# Patient Record
Sex: Male | Born: 1985 | Hispanic: No | Marital: Married | Smoking: Never smoker
Health system: Southern US, Community
[De-identification: ages and names within clinical notes are randomized; demographics above are authoritative.]

## PROBLEM LIST (undated history)

## (undated) ENCOUNTER — Emergency Department (HOSPITAL_COMMUNITY): Admission: EM | Payer: Managed Care, Other (non HMO) | Source: Home / Self Care

## (undated) DIAGNOSIS — M5030 Other cervical disc degeneration, unspecified cervical region: Secondary | ICD-10-CM

## (undated) HISTORY — PX: WISDOM TOOTH EXTRACTION: SHX21

## (undated) HISTORY — PX: CERVICAL SPINE SURGERY: SHX589

---

## 2013-06-19 ENCOUNTER — Encounter (HOSPITAL_COMMUNITY): Payer: Self-pay | Admitting: Emergency Medicine

## 2013-06-19 ENCOUNTER — Emergency Department (HOSPITAL_COMMUNITY)
Admission: EM | Admit: 2013-06-19 | Discharge: 2013-06-19 | Disposition: A | Discharge status: Home / Self Care | Payer: Managed Care, Other (non HMO) | Attending: Emergency Medicine | Admitting: Emergency Medicine

## 2013-06-19 DIAGNOSIS — J029 Acute pharyngitis, unspecified: Secondary | ICD-10-CM | POA: Insufficient documentation

## 2013-06-19 DIAGNOSIS — J069 Acute upper respiratory infection, unspecified: Secondary | ICD-10-CM

## 2013-06-19 DIAGNOSIS — Z792 Long term (current) use of antibiotics: Secondary | ICD-10-CM | POA: Insufficient documentation

## 2013-06-19 MED ORDER — PSEUDOEPHEDRINE HCL ER 120 MG PO TB12: 120.0000 mg | ORAL_TABLET | Freq: Two times a day (BID) | ORAL | Status: DC

## 2013-06-19 MED ORDER — FLUTICASONE PROPIONATE 50 MCG/ACT NA SUSP: 2.0000 | Freq: Every day | NASAL | Status: DC

## 2013-06-19 NOTE — ED Notes (Signed)
Pt c/o sore throat and nasal congestion starting last night.  Pain score 6/10.  Vitals are stable.  Sts recent travel to [place].

## 2013-06-19 NOTE — ED Provider Notes (Signed)
CSN: [id number]     Arrival date & time 06/19/13  1341 History  This chart was scribed for non-physician practitioner Mora Bellman, PA-C working with Derwood Kaplan, MD by Valera Castle, ED scribe. This patient was seen in room WTR6/WTR6 and the patient's care was started at 3:07 PM.    Chief Complaint  Patient presents with  . Sore Throat  . Nasal Congestion    The history is provided by the patient. No language interpreter was used.   HPI Comments: Scott Cline is a 27 y.o. male who presents to the Emergency Department complaining of gradual, moderate, constant sore throat, with a pain severity of 6/10, onset last night. He reports associated nasal congestion, itchiness to his right ear, unproductive cough, frontal headaches, and pain with swallowing. He reports that with cold weather it hurts to breathe, but denies deep breathing exacerbating the pain. He reports that he thinks he caught these symptoms from his brother. His brother's symptoms have now resolved. He reports taking medication for fever prevention PTA, but states he cannot remember the name. He reports recent travel to [place].  He denies fever, and any other associated symptoms. He denies smoking, or EtOH use. He has no known allergies, and denies any pertinent, prior medical history.    History reviewed. No pertinent past medical history. Past Surgical History  Procedure Laterality Date  . Wisdom tooth extraction     History reviewed. No pertinent family history. History  Substance Use Topics  . Smoking status: Never Smoker   . Smokeless tobacco: Never Used  . Alcohol Use: No    Review of Systems  Constitutional: Negative for fever.  HENT: Positive for congestion (Nasal congestion.), sore throat and trouble swallowing.        Itchiness to right ear.  Respiratory: Positive for cough. Negative for shortness of breath.   Neurological: Positive for headaches.  All other systems reviewed and are  negative.    Allergies  Review of patient's allergies indicates no known allergies.  Home Medications   Current Outpatient Rx  Name  Route  Sig  Dispense  Refill  . amoxicillin (AMOXIL) 500 MG tablet   Oral   Take 500 mg by mouth 3 (three) times daily.         Marland Kitchen HYDROcodone-acetaminophen (NORCO/VICODIN) 5-325 MG per tablet   Oral   Take 1 tablet by mouth every 6 (six) hours as needed for pain.          Triage Vitals: BP 125/81  Pulse 65  Temp(Src) 98.7 F (37.1 C) (Oral)  Resp 20  SpO2 100%  Physical Exam  Nursing note and vitals reviewed. Constitutional: He is oriented to person, place, and time. Vital signs are normal. He appears well-developed and well-nourished. He does not appear ill. No distress.  HENT:  Head: Normocephalic and atraumatic.  Right Ear: Tympanic membrane, external ear and ear canal normal.  Left Ear: Tympanic membrane, external ear and ear canal normal.  Nose: Nose normal. Right sinus exhibits no maxillary sinus tenderness and no frontal sinus tenderness. Left sinus exhibits no maxillary sinus tenderness and no frontal sinus tenderness.  Mouth/Throat: Uvula is midline and oropharynx is clear and moist. No trismus in the jaw. No uvula swelling. No oropharyngeal exudate or posterior oropharyngeal edema.  Mild nasal turbinate swelling bilaterally.   Eyes: Conjunctivae and EOM are normal. Pupils are equal, round, and reactive to light.  Neck: Trachea normal, normal range of motion and phonation normal. No tracheal deviation  present.  No nuchal rigidity or meningeal signs  Cardiovascular: Normal rate, regular rhythm, normal heart sounds, intact distal pulses and normal pulses.   Pulmonary/Chest: Effort normal and breath sounds normal. No stridor. No respiratory distress. He has no decreased breath sounds. He has no wheezes. He has no rales.  Abdominal: Soft. He exhibits no distension. There is no tenderness. There is no rigidity and no guarding.   Musculoskeletal: Normal range of motion.  Lymphadenopathy:    He has no cervical adenopathy.  Neurological: He is alert and oriented to person, place, and time.  Skin: Skin is warm and dry. No rash noted. He is not diaphoretic.  Psychiatric: He has a normal mood and affect. His behavior is normal.    ED Course  Procedures (including critical care time)  DIAGNOSTIC STUDIES: Oxygen Saturation is 100% on room air, normal by my interpretation.    COORDINATION OF CARE: 3:11 PM-Discussed treatment plan with pt at bedside and pt agreed to plan. Advised pt to f/u in 10 days if the symptoms worsen.      Labs Review Labs Reviewed  RAPID STREP SCREEN  CULTURE, GROUP A STREP   Imaging Review No results found.  MDM   1. URI (upper respiratory infection)    Pt's lungs CTA bilaterally, oxygen saturation 100% on RA. Patients symptoms are consistent with URI, likely viral etiology. Discussed that antibiotics are not indicated for viral infections. Pt will be discharged with symptomatic treatment.  Verbalizes understanding and is agreeable with plan. Pt is hemodynamically stable & in NAD prior to dc.    I personally performed the services described in this documentation, which was scribed in my presence. The recorded information has been reviewed and is accurate.     Mora Bellman, PA-C 06/19/13 [phone]

## 2013-06-20 NOTE — ED Provider Notes (Signed)
Medical screening examination/treatment/procedure(s) were performed by non-physician practitioner and as supervising physician I was immediately available for consultation/collaboration.  Derwood Kaplan, MD 06/20/13 [phone]

## 2013-06-21 LAB — CULTURE, GROUP A STREP

## 2016-04-23 ENCOUNTER — Encounter (HOSPITAL_COMMUNITY): Payer: Self-pay | Admitting: Emergency Medicine

## 2016-04-23 ENCOUNTER — Emergency Department (HOSPITAL_COMMUNITY): Payer: PPO

## 2016-04-23 ENCOUNTER — Emergency Department (HOSPITAL_COMMUNITY)
Admission: EM | Admit: 2016-04-23 | Discharge: 2016-04-23 | Disposition: A | Discharge status: Home / Self Care | Payer: PPO | Attending: Emergency Medicine | Admitting: Emergency Medicine

## 2016-04-23 DIAGNOSIS — N211 Calculus in urethra: Secondary | ICD-10-CM | POA: Insufficient documentation

## 2016-04-23 DIAGNOSIS — N23 Unspecified renal colic: Secondary | ICD-10-CM | POA: Diagnosis not present

## 2016-04-23 DIAGNOSIS — R1032 Left lower quadrant pain: Secondary | ICD-10-CM | POA: Diagnosis present

## 2016-04-23 LAB — URINALYSIS, ROUTINE W REFLEX MICROSCOPIC
Glucose, UA: NEGATIVE mg/dL
KETONES UR: NEGATIVE mg/dL
NITRITE: NEGATIVE
Protein, ur: 100 mg/dL — AB
SPECIFIC GRAVITY, URINE: 1.028 (ref 1.005–1.030)
pH: 5.5 (ref 5.0–8.0)

## 2016-04-23 LAB — URINE MICROSCOPIC-ADD ON

## 2016-04-23 MED ORDER — HYDROCODONE-ACETAMINOPHEN 5-325 MG PO TABS: 1.0000 | ORAL_TABLET | ORAL | 0 refills | Status: DC | PRN

## 2016-04-23 MED ORDER — TAMSULOSIN HCL 0.4 MG PO CAPS: ORAL_CAPSULE | ORAL | 0 refills | Status: DC

## 2016-04-23 NOTE — ED Notes (Signed)
Patient denies N/V/D. 

## 2016-04-23 NOTE — ED Notes (Signed)
PT DISCHARGED. INSTRUCTIONS AND PRESCRIPTIONS GIVEN. AAOX4. PT IN NO APPARENT DISTRESS. THE OPPORTUNITY TO ASK QUESTIONS WAS PROVIDED. 

## 2016-04-23 NOTE — ED Notes (Addendum)
Patient ambulatory to restroom.  Patient advised to give urine sample.

## 2016-04-23 NOTE — ED Notes (Signed)
Pt reported to Charles,NT after patient was moved to room that pain does radiate to testicles

## 2016-04-23 NOTE — Discharge Instructions (Signed)
Strain urine to catch the stone.  Follow-up with the urologist, if not better and for a checkup in one or 2 weeks.

## 2016-04-23 NOTE — ED Triage Notes (Signed)
Pt c/o LLQ abd pain x 1 hour, pt states awoke from sleep feeling "cold" pt states began having pain in abd with feeling like he had to have a BM. Pt unable to have BM and has not attempted to urinate.

## 2016-04-23 NOTE — ED Provider Notes (Signed)
[facility]-EMERGENCY DEPT Provider Note   CSN: [id number] Arrival date & time: 04/23/16  [id number]  First Provider Contact:  First MD Initiated Contact with Patient 04/23/16 [phone]        History   Chief Complaint Chief Complaint  Patient presents with  . Abdominal Pain    HPI Scott Cline is a 30 y.o. male.  He presents for evaluation of sudden onset of left upper abdominal pain which radiates to the left groin, earlier this morning. At the time of evaluation, the pain had resolved completely. Earlier he felt like he had to have a bowel movement, tried but was unable to. He denies hematuria, dysuria, urinary frequency, nausea, vomiting or prior similar episode of pain. There are no other known modifying factors.  HPI  History reviewed. No pertinent past medical history.  There are no active problems to display for this patient.   Past Surgical History:  Procedure Laterality Date  . WISDOM TOOTH EXTRACTION         Home Medications    Prior to Admission medications   Not on File    Family History No family history on file.  Social History Social History  Substance Use Topics  . Smoking status: Never Smoker  . Smokeless tobacco: Never Used  . Alcohol use No     Allergies   Review of patient's allergies indicates no known allergies.   Review of Systems Review of Systems  All other systems reviewed and are negative.    Physical Exam Updated Vital Signs BP 132/80 (BP Location: Left Arm)   Pulse 65   Temp 98.2 F (36.8 C) (Oral)   Resp 18   SpO2 96%   Physical Exam  Constitutional: He is oriented to person, place, and time. He appears well-developed and well-nourished.  HENT:  Head: Normocephalic and atraumatic.  Right Ear: External ear normal.  Left Ear: External ear normal.  Eyes: Conjunctivae and EOM are normal. Pupils are equal, round, and reactive to light.  Neck: Normal range of motion and phonation normal. Neck supple.  Cardiovascular: Normal  rate, regular rhythm and normal heart sounds.   Pulmonary/Chest: Effort normal and breath sounds normal. He exhibits no bony tenderness.  Abdominal: Soft. There is no tenderness.  Genitourinary:  Genitourinary Comments: No costovertebral angle tenderness to percussion  Musculoskeletal: Normal range of motion.  Neurological: He is alert and oriented to person, place, and time. No cranial nerve deficit or sensory deficit. He exhibits normal muscle tone. Coordination normal.  Skin: Skin is warm, dry and intact.  Psychiatric: He has a normal mood and affect. His behavior is normal. Judgment and thought content normal.  Nursing note and vitals reviewed.    ED Treatments / Results  Labs (all labs ordered are listed, but only abnormal results are displayed) Labs Reviewed - No data to display  EKG  EKG Interpretation None       Radiology No results found.  Procedures Procedures (including critical care time)  Medications Ordered in ED Medications - No data to display   Initial Impression / Assessment and Plan / ED Course  I have reviewed the triage vital signs and the nursing notes.  Pertinent labs & imaging results that were available during my care of the patient were reviewed by me and considered in my medical decision making (see chart for details).  Clinical Course    Medications - No data to display  Patient Vitals for the past 24 hrs:  BP Temp Temp src Pulse Resp  SpO2  04/23/16 0524 132/80 98.2 F (36.8 C) Oral 65 18 96 %    7:40 AM Reevaluation with update and discussion. After initial assessment and treatment, an updated evaluation reveals He remains comfortable. No further complaints, findings discussed with patient, all questions answered. Anisia Leija L    Final Clinical Impressions(s) / ED Diagnoses   Final diagnoses:  None    Evaluation consistent with partially obstructing left ureteral stone. Stone is small and distal. This has a high likelihood of  passing within the next 72 hours.  Nursing Notes Reviewed/ Care Coordinated Applicable Imaging Reviewed Interpretation of Laboratory Data incorporated into ED treatment  The patient appears reasonably screened and/or stabilized for discharge and I doubt any other medical condition or other [facility] requiring further screening, evaluation, or treatment in the ED at this time prior to discharge.  Plan: Home Medications- usual; Home Treatments- strain urine; return here if the recommended treatment, does not improve the symptoms; Recommended follow up- Urology 1 week   New Prescriptions New Prescriptions   No medications on file     Mancel Bale, MD 04/23/16 [phone]

## 2016-06-15 ENCOUNTER — Emergency Department (HOSPITAL_COMMUNITY)
Admission: EM | Admit: 2016-06-15 | Discharge: 2016-06-16 | Disposition: A | Discharge status: Home / Self Care | Payer: PPO | Attending: Emergency Medicine | Admitting: Emergency Medicine

## 2016-06-15 ENCOUNTER — Encounter (HOSPITAL_COMMUNITY): Payer: Self-pay | Admitting: Emergency Medicine

## 2016-06-15 DIAGNOSIS — R21 Rash and other nonspecific skin eruption: Secondary | ICD-10-CM | POA: Diagnosis present

## 2016-06-15 DIAGNOSIS — Z79899 Other long term (current) drug therapy: Secondary | ICD-10-CM | POA: Insufficient documentation

## 2016-06-15 DIAGNOSIS — B029 Zoster without complications: Secondary | ICD-10-CM

## 2016-06-15 MED ORDER — VALACYCLOVIR HCL 500 MG PO TABS
500.0000 mg | ORAL_TABLET | Freq: Once | ORAL | Status: AC
  Administered 2016-06-16: 500 mg via ORAL
  Filled 2016-06-15: qty 1

## 2016-06-15 MED ORDER — IBUPROFEN 200 MG PO TABS
600.0000 mg | ORAL_TABLET | Freq: Once | ORAL | Status: AC
  Administered 2016-06-16: 600 mg via ORAL
  Filled 2016-06-15: qty 3

## 2016-06-15 MED ORDER — HYDROCODONE-ACETAMINOPHEN 5-325 MG PO TABS
1.0000 | ORAL_TABLET | Freq: Once | ORAL | Status: AC
  Administered 2016-06-16: 1 via ORAL
  Filled 2016-06-15: qty 1

## 2016-06-15 MED ORDER — PREDNISONE 20 MG PO TABS
60.0000 mg | ORAL_TABLET | Freq: Once | ORAL | Status: AC
  Administered 2016-06-16: 60 mg via ORAL
  Filled 2016-06-15: qty 3

## 2016-06-15 NOTE — ED Provider Notes (Signed)
[facility]Y DEPT Provider Note   CSN: [id number] Arrival date & time: 06/15/16  2302  By signing my name below, I, [name], attest that this documentation has been prepared under the direction and in the presence of Derwood Kaplan, MD. Electronically Signed: [name], ED Scribe. 06/15/16. 11:48 PM.   History   Chief Complaint Chief Complaint  Patient presents with  . Rash   The history is provided by the patient. No language interpreter was used.   HPI Comments: Scott Cline is a 30 y.o. male who presents to the Emergency Department complaining of gradual onset, constant, burning pain under his left arm, L back and L chest onset 4 days. Has experienced pain before, but states pain is worse. He states pain is worse during the night and sometimes wakes him up at night. Pt reports associated rash and shortness of breath. Pt states he has trouble breathing well when the pain radiates around to his left back and left chest area. Denies recent travels or any medical problems. He notes he has experienced chicken pox before. Pt has no medical hx. Does report increased stress due to exams.  History reviewed. No pertinent past medical history.  There are no active problems to display for this patient.   Past Surgical History:  Procedure Laterality Date  . WISDOM TOOTH EXTRACTION       Home Medications    Prior to Admission medications   Medication Sig Start Date End Date Taking? Authorizing Provider  HYDROcodone-acetaminophen (NORCO/VICODIN) 5-325 MG tablet Take 1 tablet by mouth every 6 (six) hours as needed. 06/15/16   Derwood Kaplan, MD  ibuprofen (ADVIL,MOTRIN) 600 MG tablet Take 1 tablet (600 mg total) by mouth every 6 (six) hours as needed. 06/16/16   Derwood Kaplan, MD  predniSONE (DELTASONE) 10 MG tablet Take 6 tablets (60 mg total) by mouth daily. 06/16/16   Derwood Kaplan, MD  tamsulosin (FLOMAX) 0.4 MG CAPS capsule 1 q HS to aid stone passage 04/23/16   Mancel Bale,  MD  valACYclovir (VALTREX) 1000 MG tablet Take 1 tablet (1,000 mg total) by mouth 3 (three) times daily. 06/15/16 06/29/16  Derwood Kaplan, MD    Family History History reviewed. No pertinent family history.  Social History Social History  Substance Use Topics  . Smoking status: Never Smoker  . Smokeless tobacco: Never Used  . Alcohol use No    Allergies   Review of patient's allergies indicates no known allergies.   Review of Systems Review of Systems A complete 10 system review of systems was obtained and all systems are negative except as noted in the HPI and PMH.   Physical Exam Updated Vital Signs BP 132/78 (BP Location: Left Arm)   Pulse 60   Temp 98.3 F (36.8 C) (Oral)   Resp 14   Wt 196 lb 3.4 oz (89 kg)   SpO2 100%   Physical Exam  Constitutional: He is oriented to person, place, and time. He appears well-developed and well-nourished.  HENT:  Head: Normocephalic.  Eyes: EOM are normal.  Neck: Normal range of motion.  Pulmonary/Chest: Effort normal.  Abdominal: He exhibits no distension.  Musculoskeletal: Normal range of motion.  Neurological: He is alert and oriented to person, place, and time.  Skin: Rash noted.  Erythematous plaques / confluent of papules over the L anterior chest, in the axilla and the scapular area, worst over the scapular region. No oozing appreciated.   Psychiatric: He has a normal mood and affect.  Nursing note  and vitals reviewed.  ED Treatments / Results  DIAGNOSTIC STUDIES: Oxygen Saturation is 100% on RA, normal by my interpretation.    COORDINATION OF CARE: 11:47 PM Discussed treatment plan with pt at bedside which includes Valacyclovir and Prednisone and pt agreed to plan.  Labs (all labs ordered are listed, but only abnormal results are displayed) Labs Reviewed - No data to display  EKG  EKG Interpretation None       Radiology No results found.  Procedures Procedures (including critical care  time)  Medications Ordered in ED Medications  ibuprofen (ADVIL,MOTRIN) tablet 600 mg (600 mg Oral Given 06/16/16 0000)  HYDROcodone-acetaminophen (NORCO/VICODIN) 5-325 MG per tablet 1 tablet (1 tablet Oral Given 06/16/16 0001)  predniSONE (DELTASONE) tablet 60 mg (60 mg Oral Given 06/16/16 0002)  valACYclovir (VALTREX) tablet 500 mg (500 mg Oral Given 06/16/16 0002)  valACYclovir (VALTREX) tablet 500 mg (500 mg Oral Given 06/16/16 0029)     Initial Impression / Assessment and Plan / ED Course  I have reviewed the triage vital signs and the nursing notes.  Pertinent labs & imaging results that were available during my care of the patient were reviewed by me and considered in my medical decision making (see chart for details).  Clinical Course   Pt has shingles. We will d/c with antiviral and pain meds + steroids. ID f/u given. Informed that shingles is contagious - so he should avoid family touching the lesions.  Final Clinical Impressions(s) / ED Diagnoses   Final diagnoses:  Herpes zoster without complication    New Prescriptions New Prescriptions   HYDROCODONE-ACETAMINOPHEN (NORCO/VICODIN) 5-325 MG TABLET    Take 1 tablet by mouth every 6 (six) hours as needed.   IBUPROFEN (ADVIL,MOTRIN) 600 MG TABLET    Take 1 tablet (600 mg total) by mouth every 6 (six) hours as needed.   PREDNISONE (DELTASONE) 10 MG TABLET    Take 6 tablets (60 mg total) by mouth daily.   VALACYCLOVIR (VALTREX) 1000 MG TABLET    Take 1 tablet (1,000 mg total) by mouth 3 (three) times daily.     Derwood KaplanAnkit Patsey Pitstick, MD 06/16/16 [id number]

## 2016-06-15 NOTE — ED Triage Notes (Signed)
Pt is c/o pain to his back and under his left arm   Pt has a red raised rash noted on his upper back below his shoulder blade  Pt states it is painful  Sxs started about 4 days ago

## 2016-06-16 MED ORDER — HYDROCODONE-ACETAMINOPHEN 5-325 MG PO TABS: 1.0000 | ORAL_TABLET | Freq: Four times a day (QID) | ORAL | 0 refills | Status: DC | PRN

## 2016-06-16 MED ORDER — VALACYCLOVIR HCL 500 MG PO TABS
500.0000 mg | ORAL_TABLET | Freq: Once | ORAL | Status: AC
  Administered 2016-06-16: 500 mg via ORAL
  Filled 2016-06-16: qty 1

## 2016-06-16 MED ORDER — VALACYCLOVIR HCL 1 G PO TABS: 1000.0000 mg | ORAL_TABLET | Freq: Three times a day (TID) | ORAL | 0 refills | Status: AC

## 2016-06-16 MED ORDER — IBUPROFEN 600 MG PO TABS: 600.0000 mg | ORAL_TABLET | Freq: Four times a day (QID) | ORAL | 0 refills | Status: DC | PRN

## 2016-06-16 MED ORDER — PREDNISONE 10 MG PO TABS: 60.0000 mg | ORAL_TABLET | Freq: Every day | ORAL | 0 refills | Status: DC

## 2016-06-16 NOTE — Discharge Instructions (Signed)
AVOID DIRECT CONTACT.  PREGNANT WOMEN SHOULD AVOID BEING IN CLOSE CONTACT AS WELL.

## 2017-04-30 ENCOUNTER — Emergency Department (HOSPITAL_BASED_OUTPATIENT_CLINIC_OR_DEPARTMENT_OTHER): Payer: PPO

## 2017-04-30 ENCOUNTER — Emergency Department (HOSPITAL_BASED_OUTPATIENT_CLINIC_OR_DEPARTMENT_OTHER)
Admission: EM | Admit: 2017-04-30 | Discharge: 2017-04-30 | Disposition: A | Discharge status: Home / Self Care | Payer: PPO | Attending: Emergency Medicine | Admitting: Emergency Medicine

## 2017-04-30 ENCOUNTER — Encounter (HOSPITAL_BASED_OUTPATIENT_CLINIC_OR_DEPARTMENT_OTHER): Payer: Self-pay | Admitting: *Deleted

## 2017-04-30 DIAGNOSIS — M25562 Pain in left knee: Secondary | ICD-10-CM | POA: Insufficient documentation

## 2017-04-30 MED ORDER — ACETAMINOPHEN 500 MG PO TABS
1000.0000 mg | ORAL_TABLET | Freq: Once | ORAL | Status: AC
  Administered 2017-04-30: 1000 mg via ORAL
  Filled 2017-04-30: qty 2

## 2017-04-30 MED ORDER — IBUPROFEN 800 MG PO TABS
800.0000 mg | ORAL_TABLET | Freq: Once | ORAL | Status: AC
  Administered 2017-04-30: 800 mg via ORAL
  Filled 2017-04-30: qty 1

## 2017-04-30 MED ORDER — IBUPROFEN 800 MG PO TABS: 800.0000 mg | ORAL_TABLET | Freq: Three times a day (TID) | ORAL | 0 refills | Status: AC

## 2017-04-30 NOTE — ED Notes (Signed)
Wound care performed by RN Florentina Addison

## 2017-04-30 NOTE — ED Triage Notes (Signed)
Pt reports that he was playing soccer last night and had a left knee injury. Ambulatory with a limp. Redness noted

## 2017-04-30 NOTE — ED Provider Notes (Signed)
[facility] Provider Note   CSN: [id number] Arrival date & time: 04/30/17  0236     History   Chief Complaint Chief Complaint  Patient presents with  . Knee Pain    HPI Maleko Pate is a 31 y.o. male.  The history is provided by the patient.  Knee Pain   This is a new problem. The current episode started yesterday. The problem occurs constantly. The problem has not changed since onset.Pain location: left knee. The quality of the pain is described as constant. The pain is severe. Pertinent negatives include no numbness. The symptoms are aggravated by activity. He has tried nothing for the symptoms. The treatment provided no relief. There has been a history of trauma (started playing soccer this evening). Family history is significant for no rheumatoid arthritis.    History reviewed. No pertinent past medical history.  There are no active problems to display for this patient.   Past Surgical History:  Procedure Laterality Date  . WISDOM TOOTH EXTRACTION         Home Medications    Prior to Admission medications   Not on File    Family History History reviewed. No pertinent family history.  Social History Social History  Substance Use Topics  . Smoking status: Never Smoker  . Smokeless tobacco: Never Used  . Alcohol use No     Allergies   Patient has no known allergies.   Review of Systems Review of Systems  Neurological: Negative for numbness.  All other systems reviewed and are negative.    Physical Exam Updated Vital Signs BP 120/69 (BP Location: Right Arm)   Pulse (!) 58   Temp 98.3 F (36.8 C) (Oral)   Resp 18   Ht 6' (1.829 m)   Wt 89.8 kg (198 lb)   SpO2 100%   BMI 26.85 kg/m   Physical Exam  Constitutional: He is oriented to person, place, and time. He appears well-developed and well-nourished. No distress.  HENT:  Head: Normocephalic and atraumatic.  Nose: Nose normal.  Eyes: Conjunctivae and EOM are normal.    Neck: Normal range of motion. Neck supple.  Cardiovascular: Normal rate, regular rhythm, normal heart sounds and intact distal pulses.   Pulmonary/Chest: Effort normal and breath sounds normal. He has no wheezes.  Abdominal: Soft. Bowel sounds are normal.  Musculoskeletal: Normal range of motion.       Left knee: Normal.       Left ankle: Normal.       Left lower leg: Normal.       Left foot: Normal.  Negative anterior and posterior drawer tests no deformation to varus or valgus stress to patella [place] or [place]  Neurological: He is alert and oriented to person, place, and time. He displays normal reflexes.  Skin: Skin is warm and dry. Capillary refill takes less than 2 seconds.  Psychiatric: He has a normal mood and affect.     ED Treatments / Results   Vitals:   04/30/17 0249  BP: 120/69  Pulse: (!) 58  Resp: 18  Temp: 98.3 F (36.8 C)  SpO2: 100%    Radiology  Results for orders placed or performed during the hospital encounter of 04/23/16  Urinalysis, Routine w reflex microscopic (not at [facility])  Result Value Ref Range   Color, Urine RED (A) YELLOW   APPearance CLOUDY (A) CLEAR   Specific Gravity, Urine 1.028 1.005 - 1.030   pH 5.5 5.0 - 8.0   Glucose, UA  NEGATIVE NEGATIVE mg/dL   Hgb urine dipstick LARGE (A) NEGATIVE   Bilirubin Urine MODERATE (A) NEGATIVE   Ketones, ur NEGATIVE NEGATIVE mg/dL   Protein, ur 454 (A) NEGATIVE mg/dL   Nitrite NEGATIVE NEGATIVE   Leukocytes, UA SMALL (A) NEGATIVE  Urine microscopic-add on  Result Value Ref Range   Squamous Epithelial / LPF 0-5 (A) NONE SEEN   WBC, UA 6-30 0 - 5 WBC/hpf   RBC / HPF TOO NUMEROUS TO COUNT 0 - 5 RBC/hpf   Bacteria, UA FEW (A) NONE SEEN   Urine-Other MUCOUS PRESENT    Dg Knee Complete 4 Views Left  Result Date: 04/30/2017 CLINICAL DATA:  Medial knee pain EXAM: LEFT KNEE - COMPLETE 4+ VIEW COMPARISON:  None. FINDINGS: No evidence of fracture, dislocation, or joint effusion. No evidence of arthropathy or  other focal bone abnormality. Soft tissues are unremarkable. IMPRESSION: Negative. Electronically Signed   By: Jasmine Pang M.D.   On: 04/30/2017 03:11    Procedures Procedures (including critical care time)  Medications Ordered in ED Medications  acetaminophen (TYLENOL) tablet 1,000 mg (not administered)  ibuprofen (ADVIL,MOTRIN) tablet 800 mg (not administered)      Final Clinical Impressions(s) / ED Diagnoses  Knee injury: ice elevation and NSAIDs.  Follow up with your orthopedic surgeon.   The patient is very well appearing and has been observed in the ED.  Strict return precautions given for  intractable Headache, changes in vision or thinking, chest pain, dyspnea on exertion, weakness, vomiting, diarrhea,  Inability to tolerate liquids or food, fevers > 101, rashes on the skin, altered mental status or any concerns. No signs of systemic illness or infection. The patient is nontoxic-appearing on exam and vital signs are within normal limits.    I have reviewed the triage vital signs and the nursing notes. Pertinent labs &imaging results that were available during my care of the patient were reviewed by me and considered in my medical decision making (see chart for details).  After history, exam, and medical workup I feel the patient has been appropriately medically screened and is safe for discharge home. Pertinent diagnoses were discussed with the patient. Patient was given return precautions.    Tomio Kirk, MD 04/30/17 0981

## 2017-08-18 ENCOUNTER — Other Ambulatory Visit: Payer: Self-pay | Admitting: Orthopedic Surgery

## 2017-08-18 DIAGNOSIS — M25562 Pain in left knee: Secondary | ICD-10-CM

## 2017-08-18 DIAGNOSIS — R531 Weakness: Secondary | ICD-10-CM

## 2017-08-18 DIAGNOSIS — R609 Edema, unspecified: Secondary | ICD-10-CM

## 2017-08-22 ENCOUNTER — Other Ambulatory Visit: Payer: Managed Care, Other (non HMO)

## 2017-08-23 ENCOUNTER — Ambulatory Visit
Admission: RE | Admit: 2017-08-23 | Discharge: 2017-08-23 | Disposition: A | Discharge status: Home / Self Care | Payer: PPO | Source: Ambulatory Visit | Attending: Orthopedic Surgery | Admitting: Orthopedic Surgery

## 2017-08-23 DIAGNOSIS — M25562 Pain in left knee: Secondary | ICD-10-CM

## 2017-08-23 DIAGNOSIS — R531 Weakness: Secondary | ICD-10-CM

## 2017-08-23 DIAGNOSIS — R609 Edema, unspecified: Secondary | ICD-10-CM

## 2017-09-03 ENCOUNTER — Emergency Department (HOSPITAL_BASED_OUTPATIENT_CLINIC_OR_DEPARTMENT_OTHER)
Admission: EM | Admit: 2017-09-03 | Discharge: 2017-09-03 | Disposition: A | Discharge status: Home / Self Care | Payer: PPO | Attending: Emergency Medicine | Admitting: Emergency Medicine

## 2017-09-03 ENCOUNTER — Other Ambulatory Visit: Payer: Self-pay

## 2017-09-03 ENCOUNTER — Encounter (HOSPITAL_BASED_OUTPATIENT_CLINIC_OR_DEPARTMENT_OTHER): Payer: Self-pay | Admitting: Emergency Medicine

## 2017-09-03 DIAGNOSIS — H8110 Benign paroxysmal vertigo, unspecified ear: Secondary | ICD-10-CM | POA: Diagnosis not present

## 2017-09-03 DIAGNOSIS — Z79899 Other long term (current) drug therapy: Secondary | ICD-10-CM | POA: Insufficient documentation

## 2017-09-03 DIAGNOSIS — R42 Dizziness and giddiness: Secondary | ICD-10-CM | POA: Diagnosis present

## 2017-09-03 MED ORDER — ONDANSETRON 8 MG PO TBDP
8.0000 mg | ORAL_TABLET | Freq: Once | ORAL | Status: AC
Start: 2017-09-03 — End: 2017-09-03
  Administered 2017-09-03: 8 mg via ORAL
  Filled 2017-09-03: qty 1

## 2017-09-03 MED ORDER — MECLIZINE HCL 25 MG PO TABS
25.0000 mg | ORAL_TABLET | Freq: Once | ORAL | Status: AC
  Administered 2017-09-03: 25 mg via ORAL
  Filled 2017-09-03: qty 1

## 2017-09-03 MED ORDER — ONDANSETRON HCL 4 MG PO TABS: 4.0000 mg | ORAL_TABLET | Freq: Four times a day (QID) | ORAL | 0 refills | Status: AC | PRN

## 2017-09-03 MED ORDER — MECLIZINE HCL 25 MG PO TABS: 25.0000 mg | ORAL_TABLET | Freq: Three times a day (TID) | ORAL | 0 refills | Status: AC | PRN

## 2017-09-03 NOTE — ED Notes (Signed)
ED Provider at bedside. 

## 2017-09-03 NOTE — ED Provider Notes (Signed)
[facility] EMERGENCY DEPARTMENT Provider Note   CSN: [id number] Arrival date & time: 09/03/17  0054     History   Chief Complaint Chief Complaint  Patient presents with  . Dizziness  . Nausea    HPI Scott Cline is a 31 y.o. male.  The history is provided by the patient.  He is 5 days postop from cervical fusion surgery and has been taking hydrocodone-acetaminophen and methocarbamol.  This morning, he started having dizziness and nausea.  Dizziness is worse when he sits, better when he walks.  He has not vomited.  He has difficulty describing the dizziness.  He denies tinnitus or ear pain.  He has been taking polyethylene glycol because of constipation.  No other medications.  He has not had symptoms like this before.  History reviewed. No pertinent past medical history.  There are no active problems to display for this patient.   Past Surgical History:  Procedure Laterality Date  . WISDOM TOOTH EXTRACTION         Home Medications    Prior to Admission medications   Medication Sig Start Date End Date Taking? Authorizing Provider  ibuprofen (ADVIL,MOTRIN) 800 MG tablet Take 1 tablet (800 mg total) by mouth 3 (three) times daily. 04/30/17   Palumbo, April, MD    Family History No family history on file.  Social History Social History   Tobacco Use  . Smoking status: Never Smoker  . Smokeless tobacco: Never Used  Substance Use Topics  . Alcohol use: No  . Drug use: No     Allergies   Patient has no known allergies.   Review of Systems Review of Systems  All other systems reviewed and are negative.    Physical Exam Updated Vital Signs BP (!) 131/94 (BP Location: Left Arm)   Pulse 64   Temp 98.6 F (37 C) (Oral)   Resp 18   SpO2 100%   Physical Exam  Nursing note and vitals reviewed.  31 year old male, resting comfortably and in no acute distress. Vital signs are significant for hypertension. Oxygen saturation is 100%, which is  normal. Head is normocephalic and atraumatic. PERRLA, EOMI. Oropharynx is clear. Neck is nontender without adenopathy or JVD.  Surgical scar present on the right side anteriorly and healing well without signs of infection. Back is nontender and there is no CVA tenderness. Lungs are clear without rales, wheezes, or rhonchi. Chest is nontender. Heart has regular rate and rhythm without murmur. Abdomen is soft, flat, nontender without masses or hepatosplenomegaly and peristalsis is normoactive. Extremities have no cyanosis or edema, full range of motion is present. Skin is warm and dry without rash. Neurologic: Mental status is normal, cranial nerves are intact, there are no motor or sensory deficits.  There is no nystagmus.  Dizziness was worsened when he laid back. HINTS exam not done because of recent neck surgery.  ED Treatments / Results   Procedures Procedures (including critical care time)  Medications Ordered in ED Medications  ondansetron (ZOFRAN-ODT) disintegrating tablet 8 mg (8 mg Oral Given 09/03/17 0230)  meclizine (ANTIVERT) tablet 25 mg (25 mg Oral Given 09/03/17 0231)     Initial Impression / Assessment and Plan / ED Course  I have reviewed the triage vital signs and the nursing notes.  Dizziness with nausea suspicious for peripheral vertigo.  Symptoms do seem to be positional consistent with benign positional vertigo.  At this point, I do not see laboratory testing or imaging as necessary.  He is given ondansetron and meclizine and will be reevaluated.  Old records are reviewed, and he has no relevant past visits.  He feels much better after above-noted treatment.  He is discharged with prescriptions for ondansetron and meclizine.  Final Clinical Impressions(s) / ED Diagnoses   Final diagnoses:  Benign paroxysmal positional vertigo, unspecified laterality    ED Discharge Orders        Ordered    meclizine (ANTIVERT) 25 MG tablet  3 times daily PRN     09/03/17  0338    ondansetron (ZOFRAN) 4 MG tablet  Every 6 hours PRN     09/03/17 0338       Dione BoozeGlick, Nicha Hemann, MD 09/03/17 [phone]

## 2017-09-03 NOTE — ED Notes (Signed)
Pt states he is feeling much better now, and would like to go home. EDP notified.

## 2017-09-03 NOTE — ED Triage Notes (Signed)
PT presents with c/o nausea and dizziness all day . Pt reports he had neck fusion surgery Monday.

## 2017-11-10 ENCOUNTER — Other Ambulatory Visit: Payer: Self-pay

## 2017-11-10 ENCOUNTER — Emergency Department (HOSPITAL_BASED_OUTPATIENT_CLINIC_OR_DEPARTMENT_OTHER)
Admission: EM | Admit: 2017-11-10 | Discharge: 2017-11-10 | Disposition: A | Discharge status: Home / Self Care | Payer: PPO | Attending: Emergency Medicine | Admitting: Emergency Medicine

## 2017-11-10 ENCOUNTER — Encounter (HOSPITAL_BASED_OUTPATIENT_CLINIC_OR_DEPARTMENT_OTHER): Payer: Self-pay | Admitting: Emergency Medicine

## 2017-11-10 DIAGNOSIS — M5442 Lumbago with sciatica, left side: Secondary | ICD-10-CM | POA: Insufficient documentation

## 2017-11-10 HISTORY — DX: Other cervical disc degeneration, unspecified cervical region: M50.30

## 2017-11-10 MED ORDER — OXYCODONE-ACETAMINOPHEN 5-325 MG PO TABS: 1.0000 | ORAL_TABLET | Freq: Four times a day (QID) | ORAL | 0 refills | Status: AC | PRN

## 2017-11-10 NOTE — ED Provider Notes (Signed)
[facility] Provider Note: [name], MD, FACEP  CSN: [id number] MRN: [id number] ARRIVAL: 11/10/17 at 0417 ROOM: [facility]09/[facility]09   CHIEF COMPLAINT  Back Pain   HISTORY OF PRESENT ILLNESS  11/10/17 5:19 AM Scott Cline is a 32 y.o. male with a history of degenerative disc disease status post neck surgery.  He is here with about 5 months of low back pain.  The pain is located in his mid low back radiating to his left leg.  He describes the pain as a burning sensation and rates it as an 8 out of 10.  It is worse with movement of his lower back.  He denies associated weakness but does have some altered sensation in the left leg and the location of the pain.  He has an MRI scheduled for next week as well as a follow-up appointment with neurosurgery.  He is requesting pain control in the meantime.  Consultation with the [facility] state controlled substances database reveals the patient has received one prescription for oxycodone in December 2018.   Past Medical History:  Diagnosis Date  . Degenerative disc disease, cervical     Past Surgical History:  Procedure Laterality Date  . CERVICAL SPINE SURGERY    . WISDOM TOOTH EXTRACTION      No family history on file.  Social History   Tobacco Use  . Smoking status: Never Smoker  . Smokeless tobacco: Never Used  Substance Use Topics  . Alcohol use: No  . Drug use: No    Prior to Admission medications   Medication Sig Start Date End Date Taking? Authorizing Provider  ibuprofen (ADVIL,MOTRIN) 800 MG tablet Take 1 tablet (800 mg total) by mouth 3 (three) times daily. 04/30/17   Palumbo, April, MD  meclizine (ANTIVERT) 25 MG tablet Take 1 tablet (25 mg total) by mouth 3 (three) times daily as needed for dizziness. 09/03/17   Dione Booze, MD  ondansetron (ZOFRAN) 4 MG tablet Take 1 tablet (4 mg total) by mouth every 6 (six) hours as needed for nausea or vomiting. 09/03/17   Dione Booze, MD    Allergies Patient has  no known allergies.   REVIEW OF SYSTEMS  Negative except as noted here or in the History of Present Illness.   PHYSICAL EXAMINATION  Initial Vital Signs Blood pressure (!) 137/99, pulse 64, temperature 97.8 F (36.6 C), temperature source Oral, resp. rate 16, height 6' (1.829 m), weight 88.5 kg (195 lb), SpO2 100 %.  Examination General: Well-developed, well-nourished male in no acute distress; appearance consistent with age of record HENT: normocephalic; atraumatic Eyes: pupils equal, round and reactive to light; extraocular muscles intact Neck: supple Heart: regular rate and rhythm Lungs: clear to auscultation bilaterally Abdomen: soft; nondistended; nontender; bowel sounds present Back: No lumbar tenderness; negative straight leg raise bilaterally; pain on movement of lower back Extremities: No deformity; full range of motion; pulses normal Neurologic: Awake, alert and oriented; motor function intact in all extremities and symmetric; sensation intact in lower extremities but subjectively altered on the left anterior thigh; no facial droop Skin: Warm and dry Psychiatric: Normal mood and affect   RESULTS  Summary of this visit's results, reviewed by myself:   EKG Interpretation  Date/Time:    Ventricular Rate:    PR Interval:    QRS Duration:   QT Interval:    QTC Calculation:   R Axis:     Text Interpretation:        Laboratory Studies: No results  found for this or any previous visit (from the past 24 hour(s)). Imaging Studies: No results found.  ED COURSE  Nursing notes and initial vitals signs, including pulse oximetry, reviewed.  Vitals:   11/10/17 0425  BP: (!) 137/99  Pulse: 64  Resp: 16  Temp: 97.8 F (36.6 C)  TempSrc: Oral  SpO2: 100%  Weight: 88.5 kg (195 lb)  Height: 6' (1.829 m)   Symptoms and exam consistent with sciatica.  PROCEDURES    ED DIAGNOSES     ICD-10-CM   1. Acute midline low back pain with left-sided sciatica M54.42         Scott David, MD 11/10/17 0530

## 2017-11-10 NOTE — ED Triage Notes (Signed)
Pt presents with c/o lower back pain for the past 5 months but worse in the past few days. Pt had neck surgery in dec 2017 and now has the same pain in his left leg (burning). Pt called PCP and has MRI scheduled for next week

## 2018-01-09 ENCOUNTER — Other Ambulatory Visit: Payer: Self-pay | Admitting: Orthopedic Surgery

## 2018-01-09 DIAGNOSIS — M25512 Pain in left shoulder: Secondary | ICD-10-CM

## 2018-01-19 ENCOUNTER — Ambulatory Visit
Admission: RE | Admit: 2018-01-19 | Discharge: 2018-01-19 | Disposition: A | Discharge status: Home / Self Care | Payer: PPO | Source: Ambulatory Visit | Attending: Orthopedic Surgery | Admitting: Orthopedic Surgery

## 2018-01-19 DIAGNOSIS — M25512 Pain in left shoulder: Secondary | ICD-10-CM

## 2018-01-19 MED ORDER — IOPAMIDOL (ISOVUE-M 200) INJECTION 41%
15.0000 mL | Freq: Once | INTRAMUSCULAR | Status: AC
  Administered 2018-01-19: 15 mL via INTRA_ARTICULAR

## 2018-02-14 ENCOUNTER — Other Ambulatory Visit: Payer: Self-pay | Admitting: Orthopedic Surgery

## 2018-02-14 DIAGNOSIS — M545 Low back pain: Principal | ICD-10-CM

## 2018-02-14 DIAGNOSIS — G8929 Other chronic pain: Secondary | ICD-10-CM

## 2018-02-28 ENCOUNTER — Ambulatory Visit
Admission: RE | Admit: 2018-02-28 | Discharge: 2018-02-28 | Disposition: A | Discharge status: Home / Self Care | Payer: PPO | Source: Ambulatory Visit | Attending: Orthopedic Surgery | Admitting: Orthopedic Surgery

## 2018-02-28 DIAGNOSIS — G8929 Other chronic pain: Secondary | ICD-10-CM

## 2018-02-28 DIAGNOSIS — M545 Low back pain: Principal | ICD-10-CM

## 2018-07-05 ENCOUNTER — Other Ambulatory Visit: Payer: Self-pay | Admitting: Orthopedic Surgery

## 2018-07-05 DIAGNOSIS — S83522A Sprain of posterior cruciate ligament of left knee, initial encounter: Secondary | ICD-10-CM

## 2018-07-05 DIAGNOSIS — R52 Pain, unspecified: Secondary | ICD-10-CM

## 2018-07-05 DIAGNOSIS — M25562 Pain in left knee: Secondary | ICD-10-CM

## 2018-07-05 DIAGNOSIS — M25662 Stiffness of left knee, not elsewhere classified: Secondary | ICD-10-CM

## 2018-07-05 DIAGNOSIS — M2352 Chronic instability of knee, left knee: Secondary | ICD-10-CM

## 2018-07-10 ENCOUNTER — Ambulatory Visit
Admission: RE | Admit: 2018-07-10 | Discharge: 2018-07-10 | Disposition: A | Discharge status: Home / Self Care | Payer: PPO | Source: Ambulatory Visit | Attending: Orthopedic Surgery | Admitting: Orthopedic Surgery

## 2018-07-10 DIAGNOSIS — R52 Pain, unspecified: Secondary | ICD-10-CM

## 2018-07-10 DIAGNOSIS — M25662 Stiffness of left knee, not elsewhere classified: Secondary | ICD-10-CM

## 2018-07-10 DIAGNOSIS — M2352 Chronic instability of knee, left knee: Secondary | ICD-10-CM

## 2018-07-10 DIAGNOSIS — S83522A Sprain of posterior cruciate ligament of left knee, initial encounter: Secondary | ICD-10-CM

## 2018-07-10 DIAGNOSIS — M25562 Pain in left knee: Secondary | ICD-10-CM

## 2020-06-21 IMAGING — MR MR KNEE*L* W/O CM
6 of 8 series · 28 of 40 positions shown · non-contrast
Comparison: MRI left knee dated August 23, 2017.

CLINICAL DATA: Persistent medial and anterior knee pain. Recent PCL
reconstruction after soccer injury in August 2017.

EXAM:
MRI OF THE LEFT KNEE WITHOUT CONTRAST
TECHNIQUE: Multiplanar, multisequence MR imaging of the knee was performed. No
intravenous contrast was administered.

[Series 3: t2_tse_fs_tra · axial · 4.0mm · 0.62mm/px · z∈[-98,+55]mm · 7 of 33 slices shown]
[im 1/33]
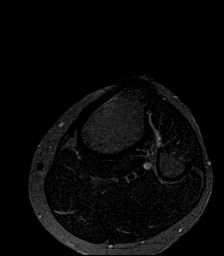
[im 6/33]
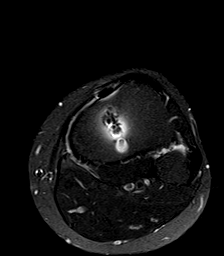
[im 11/33]
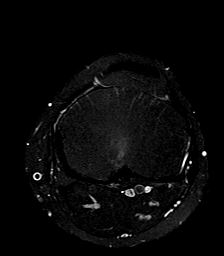
[im 17/33]
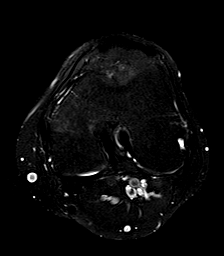
[im 22/33]
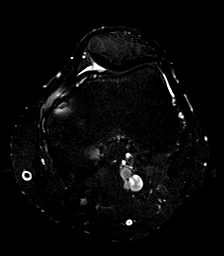
[im 27/33]
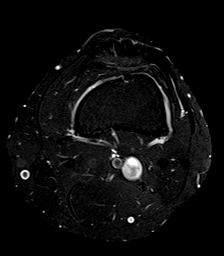
[im 33/33]
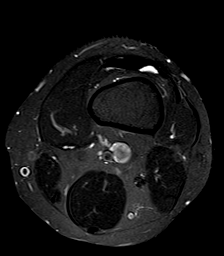

[Series 4: T2 fat-sat · coronal · 4.0mm · 0.62mm/px · 5 of 29 slices shown (1 of 2)]
[im 1/29]
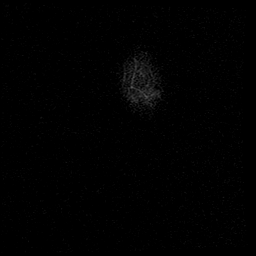
[im 8/29]
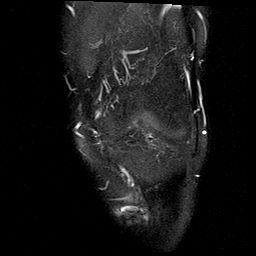
[im 15/29]
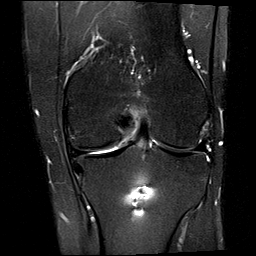
[im 22/29]
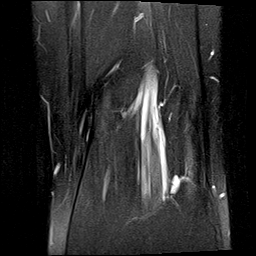
[im 29/29]
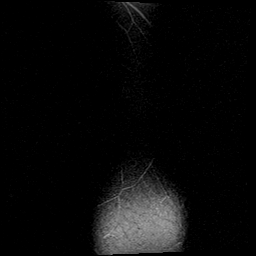

[Series 5: T1 · coronal · 4.0mm · 0.31mm/px · 5 of 29 slices shown]
[im 1/29]
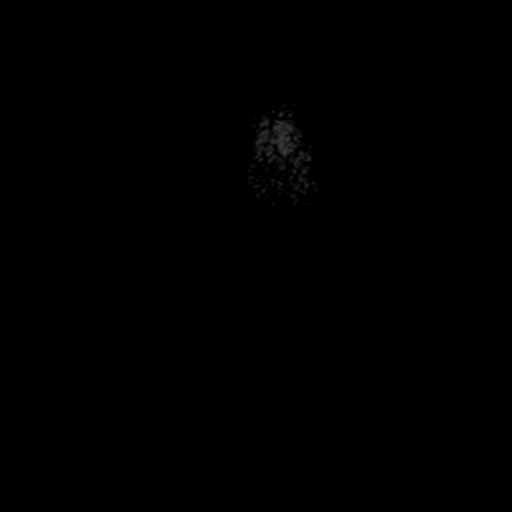
[im 8/29]
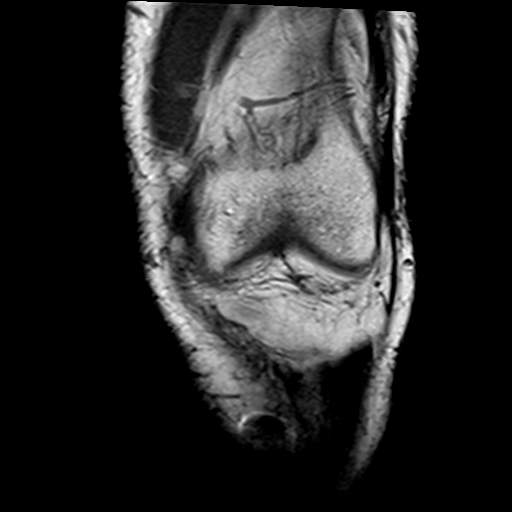
[im 15/29]
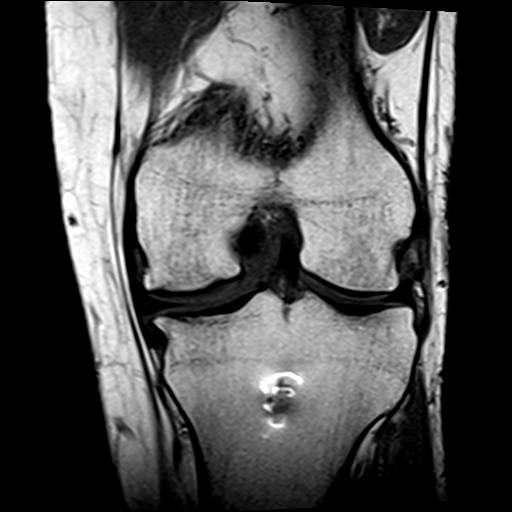
[im 22/29]
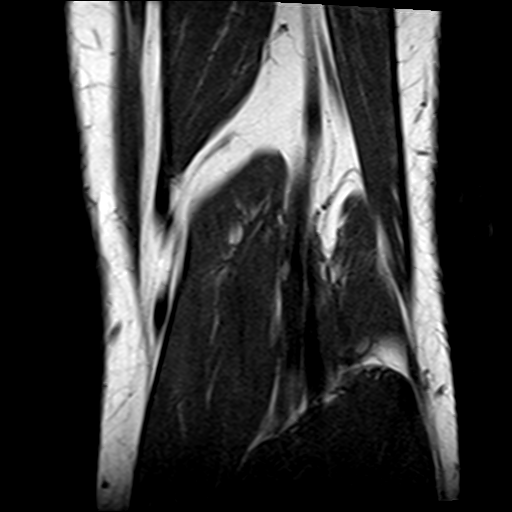
[im 29/29]
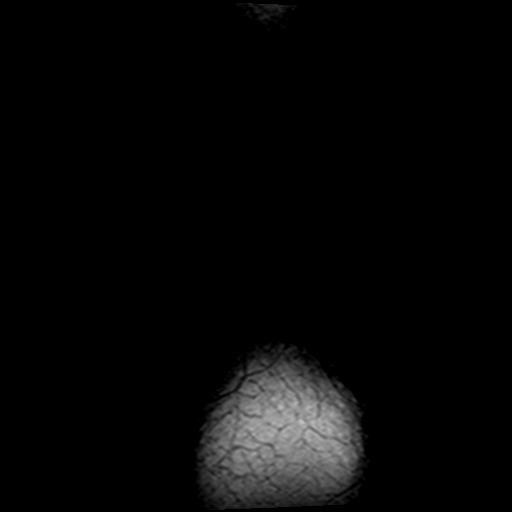

[Series 6: pd_tse_fs_cor, 3mm copy · coronal · 3.0mm · 0.29mm/px · 1 of 36 slices shown]
[im 1/36]
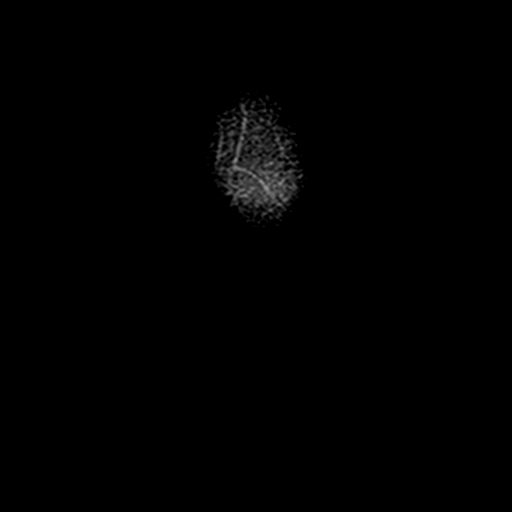

[Series 7: PD fat-sat · sagittal · 3.0mm · 0.31mm/px · 5 of 31 slices shown]
[im 1/31]
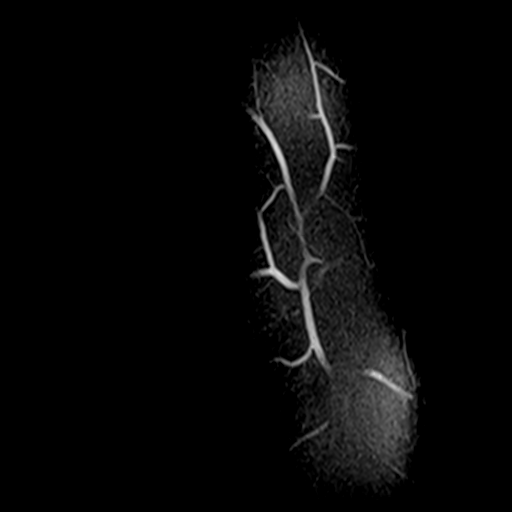
[im 8/31]
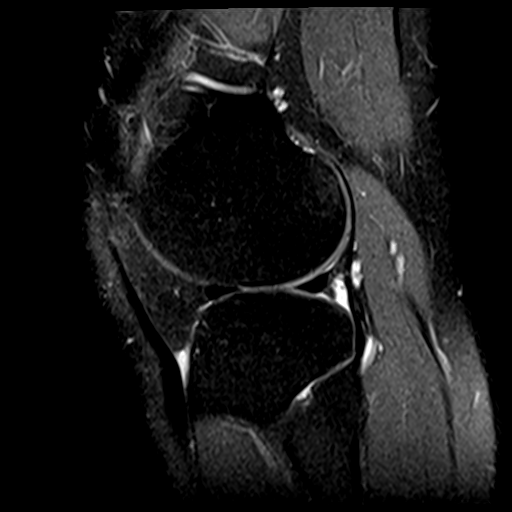
[im 16/31]
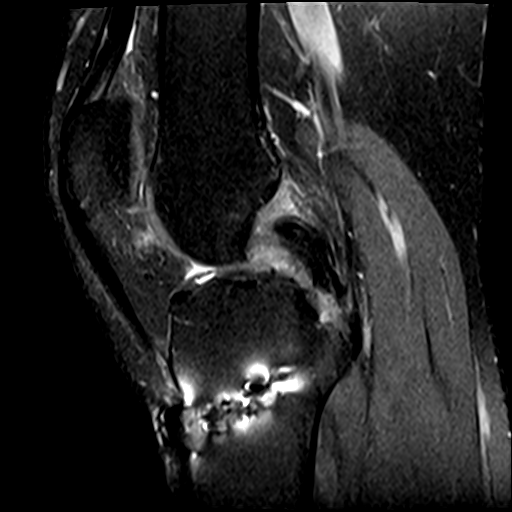
[im 23/31]
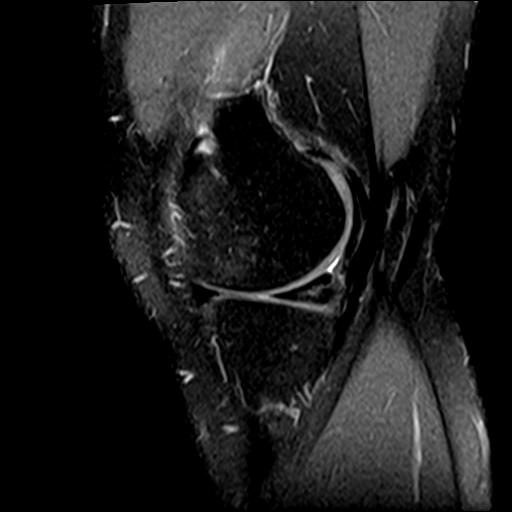
[im 31/31]
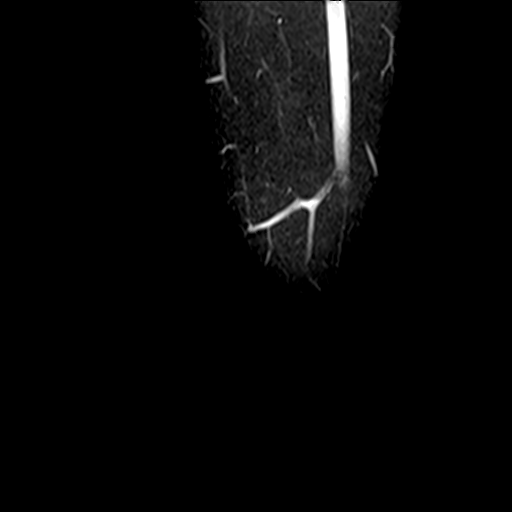

[Series 8: T2 fat-sat · sagittal · 3.0mm · 0.31mm/px · 5 of 31 slices shown (2 of 2)]
[im 1/31]
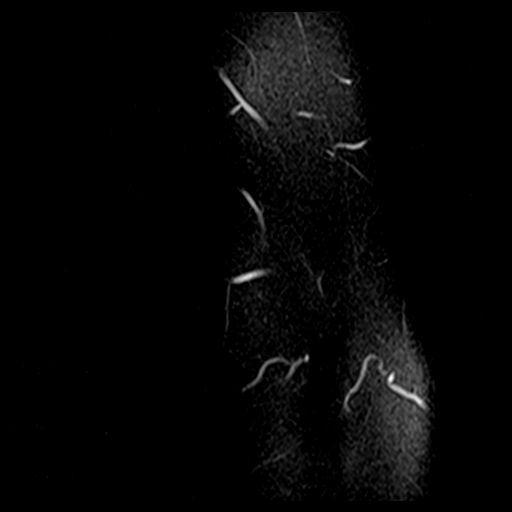
[im 8/31]
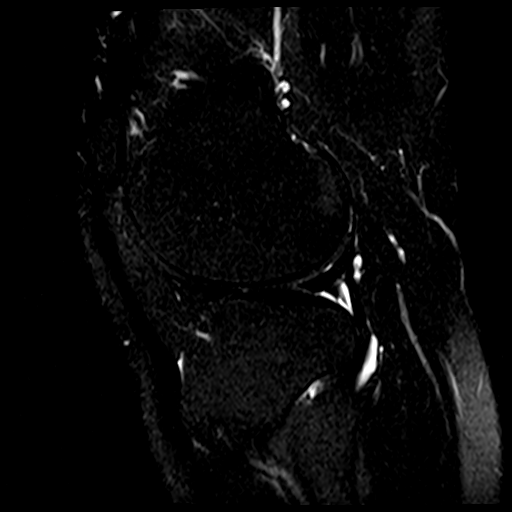
[im 16/31]
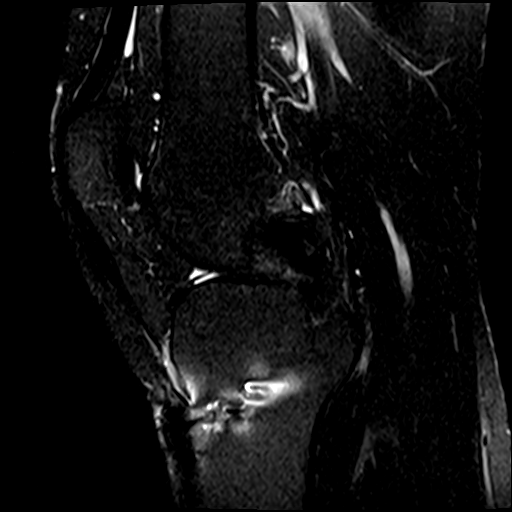
[im 23/31]
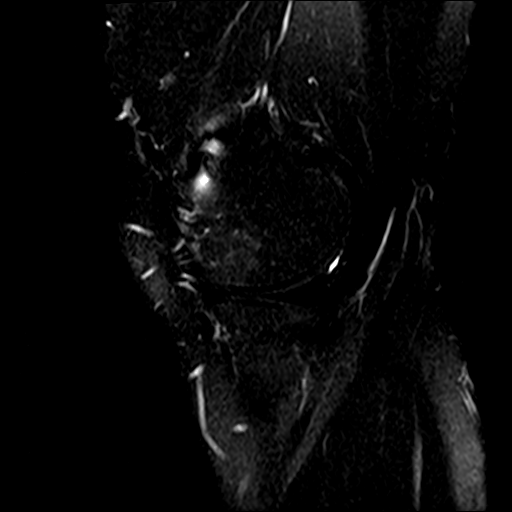
[im 31/31]
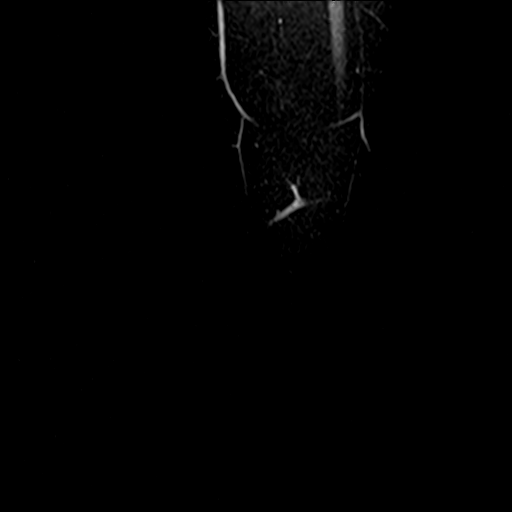

[28 of 40 positions shown; findings below may reference images not displayed]

FINDINGS: MENISCI

Medial meniscus: Intact. Unchanged degenerative signal within the
posterior horn.

Lateral meniscus:  Intact.

LIGAMENTS

Cruciates: Intact ACL. Interval PCL reconstruction with intact
graft.

Collaterals: Medial collateral ligament is intact. Lateral
collateral ligament complex is intact.

CARTILAGE

Patellofemoral:  No chondral defect.

Medial: Mild partial-thickness cartilage loss of the medial
femorotibial compartment.

Lateral:  No chondral defect.

Joint:  No joint effusion. Normal Hoffa's fat. No plical thickening.

Popliteal Fossa:  No Baker cyst. Intact popliteus tendon.

Extensor Mechanism: Intact quadriceps tendon and patellar tendon.
Intact medial and lateral patellar retinaculum. Intact MPFL.

Bones: No focal marrow signal abnormality. No fracture or
dislocation.

Other: None.
IMPRESSION: 1. Interval PCL reconstruction with intact graft. No ligamentous or
meniscal injury.
2. Unchanged mild partial-thickness cartilage loss of the medial
compartment.
